# Patient Record
Sex: Male | Born: 2000 | Race: White | Hispanic: No | Marital: Single | Smoking: Never smoker
Health system: Southern US, Community
[De-identification: ages and names within clinical notes are randomized; demographics above are authoritative.]

## PROBLEM LIST (undated history)

## (undated) HISTORY — PX: TONSILLECTOMY: SUR1361

---
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
## 2019-01-24 ENCOUNTER — Emergency Department (HOSPITAL_BASED_OUTPATIENT_CLINIC_OR_DEPARTMENT_OTHER)
Admission: EM | Admit: 2019-01-24 | Discharge: 2019-01-24 | Disposition: A | Discharge status: Home / Self Care | Payer: 59 | Attending: Emergency Medicine | Admitting: Emergency Medicine

## 2019-01-24 ENCOUNTER — Encounter (HOSPITAL_BASED_OUTPATIENT_CLINIC_OR_DEPARTMENT_OTHER): Payer: Self-pay | Admitting: *Deleted

## 2019-01-24 ENCOUNTER — Other Ambulatory Visit: Payer: Self-pay

## 2019-01-24 DIAGNOSIS — R21 Rash and other nonspecific skin eruption: Secondary | ICD-10-CM | POA: Diagnosis present

## 2019-01-24 DIAGNOSIS — T7840XA Allergy, unspecified, initial encounter: Secondary | ICD-10-CM | POA: Diagnosis not present

## 2019-01-24 MED ORDER — FAMOTIDINE IN NACL 20-0.9 MG/50ML-% IV SOLN
20.0000 mg | Freq: Once | INTRAVENOUS | Status: AC
  Administered 2019-01-24: 20 mg via INTRAVENOUS
  Filled 2019-01-24: qty 50

## 2019-01-24 MED ORDER — DIPHENHYDRAMINE HCL 50 MG/ML IJ SOLN
12.5000 mg | Freq: Once | INTRAMUSCULAR | Status: AC
  Administered 2019-01-24: 12.5 mg via INTRAVENOUS
  Filled 2019-01-24: qty 1

## 2019-01-24 MED ORDER — PREDNISONE 20 MG PO TABS: 40.0000 mg | ORAL_TABLET | Freq: Every day | ORAL | 0 refills | Status: DC

## 2019-01-24 MED ORDER — METHYLPREDNISOLONE SODIUM SUCC 125 MG IJ SOLR
125.0000 mg | Freq: Once | INTRAMUSCULAR | Status: AC
  Administered 2019-01-24: 125 mg via INTRAVENOUS
  Filled 2019-01-24: qty 2

## 2019-01-24 NOTE — ED Notes (Signed)
Patient verbalizes understanding of discharge instructions. Opportunity for questioning and answers were provided. Armband removed by staff, pt discharged from ED.  

## 2019-01-24 NOTE — Discharge Instructions (Signed)
Take another dose of Benadryl when you wake up in the morning if you are having any itching or rashes.  You have a prescription for 2 days of steroids to prevent the reaction from returning.  You need to take the first dose tomorrow around lunchtime.

## 2019-01-24 NOTE — ED Provider Notes (Signed)
[facility] Provider Note   CSN: [id number] Arrival date & time: 01/24/19  2043     History   Chief Complaint Chief Complaint  Patient presents with  . Allergic Reaction    HPI Craig Moses is a 18 y.o. male.     Patient is an 18 year old male with prior history to bee sting allergies presenting today with rash and itching.  Patient states he was laying on the ground working on his truck when he suddenly started feeling itching to the right side of his neck.  Approximately 30 minutes later he broke out in a rash that is worsening and traveling down his body.  He tried liquid Benadryl at home without any improvement.  He is having intermittent chest tightness but it is not constant and he denies any shortness of breath or throat swelling.  He takes no medications regularly and has no medication allergies.  He is feeling normal until 2 hours ago when all this started.  The history is provided by the patient.  Allergic Reaction Presenting symptoms: itching and rash   Presenting symptoms comment:  Intermittent chest pain but no shortness of breath, mouth swelling or trouble breathing Severity:  Moderate Duration:  2 hours Prior allergic episodes:  Insect allergies Context: insect bite/sting   Context comment:  Thinks that he may have been bit or stung by an ant Relieved by:  Nothing Worsened by:  Nothing Ineffective treatments:  Antihistamines (Took liquid Benadryl without improvement)   No past medical history on file.  There are no active problems to display for this patient.   Past Surgical History:  Procedure Laterality Date  . TONSILLECTOMY          Home Medications    Prior to Admission medications   Not on File    Family History No family history on file.  Social History Social History   Tobacco Use  . Smoking status: Never Smoker  Substance Use Topics  . Alcohol use: Not on file  . Drug use: Not on file      Allergies   Patient has no known allergies.   Review of Systems Review of Systems  Skin: Positive for itching and rash.  All other systems reviewed and are negative.    Physical Exam Updated Vital Signs There were no vitals taken for this visit.  Physical Exam Vitals signs and nursing note reviewed.  Constitutional:      General: He is not in acute distress.    Appearance: He is well-developed.  HENT:     Head: Normocephalic and atraumatic.     Mouth/Throat:     Mouth: Mucous membranes are moist.     Comments: No uvular or tongue edema.  No stridor Eyes:     Conjunctiva/sclera: Conjunctivae normal.     Pupils: Pupils are equal, round, and reactive to light.  Neck:     Musculoskeletal: Normal range of motion and neck supple.  Cardiovascular:     Rate and Rhythm: Normal rate and regular rhythm.     Heart sounds: No murmur.  Pulmonary:     Effort: Pulmonary effort is normal. No respiratory distress.     Breath sounds: Normal breath sounds. No wheezing or rales.  Abdominal:     General: There is no distension.     Palpations: Abdomen is soft.     Tenderness: There is no abdominal tenderness. There is no guarding or rebound.  Musculoskeletal: Normal range of motion.  General: No tenderness.  Skin:    General: Skin is warm and dry.     Findings: Rash present. No erythema. Rash is urticarial.     Comments: Rash is mostly localized to the upper chest and the large patchy areas but also small areas on the legs, arms and lower abdomen  Neurological:     Mental Status: He is alert and oriented to person, place, and time.  Psychiatric:        Mood and Affect: Mood normal.        Behavior: Behavior normal.        Thought Content: Thought content normal.      ED Treatments / Results  Labs (all labs ordered are listed, but only abnormal results are displayed) Labs Reviewed - No data to display  EKG    Radiology No results found.  Procedures Procedures  (including critical care time)  Medications Ordered in ED Medications  methylPREDNISolone sodium succinate (SOLU-MEDROL) 125 mg/2 mL injection 125 mg (has no administration in time range)  famotidine (PEPCID) IVPB 20 mg premix (has no administration in time range)  diphenhydrAMINE (BENADRYL) injection 12.5 mg (has no administration in time range)     Initial Impression / Assessment and Plan / ED Course  I have reviewed the triage vital signs and the nursing notes.  Pertinent labs & imaging results that were available during my care of the patient were reviewed by me and considered in my medical decision making (see chart for details).       Healthy 18 year old male presenting with what appears to be an allergic reaction for most likely some type of insect sting.  He has no respiratory distress at this time but is having intermittent chest discomfort.  EKG without acute findings.  Patient had taken some liquid Benadryl at home but was given 12.5 of IV Benadryl, Solu-Medrol and Pepcid.  Will reevaluate in 1 hour and if symptoms worsen will give epi  9:48 PM After IV meds patient is feeling much better and rash is almost completely resolved.  Will discharge home with prednisone and to continue Benadryl every 6 hours.  Final Clinical Impressions(s) / ED Diagnoses   Final diagnoses:  Allergic reaction, initial encounter    ED Discharge Orders         Ordered    predniSONE (DELTASONE) 20 MG tablet  Daily     01/24/19 2148           Gwyneth SproutPlunkett, Starlynn Klinkner, MD 01/24/19 2149

## 2019-01-24 NOTE — ED Triage Notes (Addendum)
Pt presents with an allergic reaction that started approx 2 hours ago. States he think she was bitten by something. States he started having discomfort in the right side of his neck and then 30 min later he developed a general rash. C/o chest pain. Denies sob. States hx of  allergy to a bee stings. Denies any new foods or products. Took liquid benadryl pta 30 min ago

## 2021-10-16 ENCOUNTER — Other Ambulatory Visit: Payer: Self-pay

## 2021-10-16 ENCOUNTER — Encounter (HOSPITAL_BASED_OUTPATIENT_CLINIC_OR_DEPARTMENT_OTHER): Payer: Self-pay | Admitting: *Deleted

## 2021-10-16 ENCOUNTER — Emergency Department (HOSPITAL_BASED_OUTPATIENT_CLINIC_OR_DEPARTMENT_OTHER)
Admission: EM | Admit: 2021-10-16 | Discharge: 2021-10-16 | Disposition: A | Discharge status: Home / Self Care | Payer: 59 | Attending: Emergency Medicine | Admitting: Emergency Medicine

## 2021-10-16 DIAGNOSIS — R21 Rash and other nonspecific skin eruption: Secondary | ICD-10-CM | POA: Diagnosis present

## 2021-10-16 DIAGNOSIS — L237 Allergic contact dermatitis due to plants, except food: Secondary | ICD-10-CM | POA: Diagnosis not present

## 2021-10-16 MED ORDER — PREDNISONE 20 MG PO TABS: ORAL_TABLET | ORAL | 0 refills | Status: AC

## 2021-10-16 MED ORDER — HYDROXYZINE HCL 25 MG PO TABS: 25.0000 mg | ORAL_TABLET | Freq: Four times a day (QID) | ORAL | 0 refills | Status: AC

## 2021-10-16 MED ORDER — DEXAMETHASONE SODIUM PHOSPHATE 10 MG/ML IJ SOLN
10.0000 mg | Freq: Once | INTRAMUSCULAR | Status: AC
Start: 2021-10-16 — End: 2021-10-16
  Administered 2021-10-16: 10 mg via INTRAMUSCULAR
  Filled 2021-10-16: qty 1

## 2021-10-16 NOTE — Discharge Instructions (Signed)
  Contact a health care provider if you have: Open sores in the rash area. More redness, swelling, or pain in the affected area. Redness that spreads beyond the rash area. Fluid, blood, or pus coming from the affected area. A fever. A rash over a large area of your body. A rash on your eyes, mouth, or genitals. A rash that does not improve after a few weeks. Get help right away if: Your face swells or your eyes swell shut. You have trouble breathing. You have trouble swallowing. 

## 2021-10-16 NOTE — ED Provider Notes (Signed)
?[facility] ?Provider Note ? ? ?CSN: [id number] ?Arrival date & time: 10/16/21  1320 ? ?  ? ?History ? ?Chief Complaint  ?Patient presents with  ? Rash  ? ? ?Craig Moses is a 21 y.o. male who presents emergency department chief complaint of rash.  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  He developed a rash over his torso and now has some breaking out over his face arms and legs.  He has been using hydrocortisone lotion.  He has itching over his eyelids but does not have any significant facial swelling, pain, fever or chills.  He has had poison ivy dermatitis multiple times before and states that he is sure this is what he has. ? ? ?Rash ? ?  ? ?Home Medications ?Prior to Admission medications   ?Medication Sig Start Date End Date Taking? Authorizing Provider  ?hydrOXYzine (ATARAX) 25 MG tablet Take 1 tablet (25 mg total) by mouth every 6 (six) hours. 10/16/21  Yes Margarita Mail, PA-C  ?predniSONE (DELTASONE) 20 MG tablet 3 tabs po daily x 3 days, then 2 tabs x 3 days, then 1.5 tabs x 3 days, then 1 tab x 3 days, then 0.5 tabs x 3 days 10/16/21  Yes Margarita Mail, PA-C  ?   ? ?Allergies    ?Patient has no known allergies.   ? ?Review of Systems   ?Review of Systems  ?Skin:  Positive for rash.  ? ?Physical Exam ?Updated Vital Signs ?BP 138/90 (BP Location: Right Arm)   Pulse 64   Temp 98 ?F (36.7 ?C) (Oral)   Resp 16   Ht 5\' 5"  (1.651 m)   Wt 70.3 kg   SpO2 100%   BMI 25.79 kg/m?  ?Physical Exam ?Physical Exam  ?Nursing note and vitals reviewed. ?Constitutional: He appears well-developed and well-nourished. No distress.  ?HENT:  ?Head: Normocephalic and atraumatic.  ?Eyes: Conjunctivae normal are normal. No scleral icterus.  ?Neck: Normal range of motion. Neck supple.  ?Cardiovascular: Normal rate, regular rhythm and normal heart sounds.   ?Pulmonary/Chest: Effort normal and breath sounds normal. No respiratory distress.  ?Abdominal: Soft.  There is no tenderness.  ?Musculoskeletal: He exhibits no edema.  ?Neurological: He is alert.  ?Skin: Erythematous rash consistent of singular and confluent erythematous papules and vesicles in various states of eruption over the torso arms face and legs.  No weeping at this time, no bullous formation, no lymphangitis. ?Psychiatric: His behavior is normal.  ? ?ED Results / Procedures / Treatments   ?Labs ?(all labs ordered are listed, but only abnormal results are displayed) ?Labs Reviewed - No data to display ? ?EKG ?None ? ?Radiology ?No results found. ? ?Procedures ?Procedures  ? ? ?Medications Ordered in ED ?Medications  ?dexamethasone (DECADRON) injection 10 mg (has no administration in time range)  ? ? ?ED Course/ Medical Decision Making/ A&P ?  ?                        ?Medical Decision Making ?Problems Addressed: ?Poison ivy dermatitis: acute illness or injury ? ? ?Patient here with rash.  Clinical appearance is consistent with poison ivy dermatitis.  There are no obvious signs of secondary infection.  Does not appear to be consistent with chickenpox or other viral eruption.  Patient will be discharged after IM steroid injection of 10 mg of Decadron, will give a 2-week steroid taper.  Patient will use  over-the-counter medications.  We will also discharge with Atarax.  Discussed return precautions ? ?Final Clinical Impression(s) / ED Diagnoses ?Final diagnoses:  ?Poison ivy dermatitis  ? ? ?Rx / DC Orders ?ED Discharge Orders   ? ?      Ordered  ?  hydrOXYzine (ATARAX) 25 MG tablet  Every 6 hours       ? 10/16/21 1519  ?  predniSONE (DELTASONE) 20 MG tablet       ? 10/16/21 1519  ? ?  ?  ? ?  ? ? ?  ?Margarita Mail, PA-C ?10/16/21 1520 ? ?  ?Lennice Sites, DO ?10/16/21 1717 ? ?

## 2021-10-16 NOTE — ED Triage Notes (Signed)
Rash. He feels like he has poison ivy and needs a steroid injection.  ?

## 2021-12-17 ENCOUNTER — Other Ambulatory Visit: Payer: Self-pay

## 2021-12-17 ENCOUNTER — Encounter (HOSPITAL_BASED_OUTPATIENT_CLINIC_OR_DEPARTMENT_OTHER): Payer: Self-pay

## 2021-12-17 DIAGNOSIS — M79605 Pain in left leg: Secondary | ICD-10-CM | POA: Insufficient documentation

## 2021-12-17 DIAGNOSIS — R200 Anesthesia of skin: Secondary | ICD-10-CM | POA: Insufficient documentation

## 2021-12-17 DIAGNOSIS — Z5321 Procedure and treatment not carried out due to patient leaving prior to being seen by health care provider: Secondary | ICD-10-CM | POA: Diagnosis not present

## 2021-12-17 NOTE — ED Triage Notes (Signed)
Patient here POV from Home.  Endorses Left Leg Pain/Numbness for approximately 1 Week. Intermittent in [place] usually but has remained constant for the past two hours.  Sensation is more oriented to Lateral Aspect of leg and there is No Known Trauma or injury.  NAD Noted during Triage. A&Ox4. GCS 15. Ambulatory

## 2021-12-18 ENCOUNTER — Emergency Department (HOSPITAL_BASED_OUTPATIENT_CLINIC_OR_DEPARTMENT_OTHER)
Admission: EM | Admit: 2021-12-18 | Discharge: 2021-12-18 | Discharge status: Against Medical Advice | Payer: 59 | Attending: Emergency Medicine | Admitting: Emergency Medicine
